# Patient Record
Sex: Male | Born: 1990 | Race: White | Hispanic: No | Marital: Married | Smoking: Never smoker
Health system: Southern US, Community
[De-identification: ages and names within clinical notes are randomized; demographics above are authoritative.]

## PROBLEM LIST (undated history)

## (undated) HISTORY — PX: VASECTOMY: SHX75

---

## 2016-01-10 ENCOUNTER — Encounter: Payer: Self-pay | Admitting: Emergency Medicine

## 2016-01-10 ENCOUNTER — Ambulatory Visit
Admission: EM | Admit: 2016-01-10 | Discharge: 2016-01-10 | Disposition: A | Discharge status: Home / Self Care | Payer: Managed Care, Other (non HMO) | Attending: Family Medicine | Admitting: Family Medicine

## 2016-01-10 DIAGNOSIS — R200 Anesthesia of skin: Secondary | ICD-10-CM | POA: Diagnosis not present

## 2016-01-10 NOTE — ED Notes (Signed)
Patient c/o numbness in his left 2nd finger since last night.  Patient denies pain or injury.

## 2016-01-10 NOTE — ED Provider Notes (Signed)
CSN: [id number]     Arrival date & time 01/10/16  1505 History   First MD Initiated Contact with Patient 01/10/16 1614     Chief Complaint  Patient presents with  . Numbness   (Consider location/radiation/quality/duration/timing/severity/associated sxs/prior Treatment) HPI Comments: 25 yo male with a c/o left index finger numbness since last night. Denies any pain, injury/trauma, fevers, chills, swelling, redness, discoloration, weakness, vision changes, speech or swallowing problems, neck pain, arm pain, arm/facial or lower extremity numbness or weakness. 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   The history is provided by the patient.    History reviewed. No pertinent past medical history. History reviewed. No pertinent past surgical history. History reviewed. No pertinent family history. Social History  Substance Use Topics  . Smoking status: Never Smoker   . Smokeless tobacco: None  . Alcohol Use: Yes    Review of Systems  Allergies  Review of patient's allergies indicates no known allergies.  Home Medications   Prior to Admission medications   Not on File   Meds Ordered and Administered this Visit  Medications - No data to display  BP 135/82 mmHg  Pulse 75  Temp(Src) 97.5 F (36.4 C) (Tympanic)  Resp 16  Ht  (1.854 m)  Wt 240 lb (108.863 kg)  BMI 31.67 kg/m2  SpO2 99% No data found.   Physical Exam  Constitutional: He appears well-developed and well-nourished. No distress.  Cardiovascular: Intact distal pulses.   Musculoskeletal: He exhibits no edema.       Left hand: He exhibits normal range of motion, no tenderness, no bony tenderness, normal two-point discrimination, normal capillary refill, no deformity, no laceration and no swelling. Normal sensation noted. Decreased sensation is not present in the ulnar distribution, is not present in the medial redistribution and is not present in the radial  distribution. Normal strength noted. He exhibits no finger abduction, no thumb/finger opposition and no wrist extension trouble.  Normal pulses and capillary refill of left hand  Neurological: He has normal strength.  Positive phalen's test  Skin: He is not diaphoretic.    ED Course  Procedures (including critical care time)  Labs Review Labs Reviewed - No data to display  Imaging Review No results found.   Visual Acuity Review  Right Eye Distance:   Left Eye Distance:   Bilateral Distance:    Right Eye Near:   Left Eye Near:    Bilateral Near:         MDM   1. Finger numbness   (likely secondary to carpal tunnel syndrome)  There are no discharge medications for this patient.  1. Diagnosis and possible etiologies reviewed with patient 2. Recommend supportive treatment with otc NSAIDS, wrist splint 3. Recommend follow-up with neurologist and/or hand orthopedist for further evaluation and management if no improvement   Payton Mccallum, MD 01/10/16 2138

## 2016-01-10 NOTE — Discharge Instructions (Signed)

## 2017-06-01 ENCOUNTER — Ambulatory Visit: Payer: Managed Care, Other (non HMO) | Admitting: Internal Medicine

## 2017-10-04 ENCOUNTER — Emergency Department
Admission: EM | Admit: 2017-10-04 | Discharge: 2017-10-04 | Disposition: A | Discharge status: Home / Self Care | Payer: PRIVATE HEALTH INSURANCE | Attending: Emergency Medicine | Admitting: Emergency Medicine

## 2017-10-04 ENCOUNTER — Encounter: Payer: Self-pay | Admitting: Emergency Medicine

## 2017-10-04 DIAGNOSIS — S0083XA Contusion of other part of head, initial encounter: Secondary | ICD-10-CM | POA: Insufficient documentation

## 2017-10-04 DIAGNOSIS — Y990 Civilian activity done for income or pay: Secondary | ICD-10-CM | POA: Insufficient documentation

## 2017-10-04 DIAGNOSIS — S0993XA Unspecified injury of face, initial encounter: Secondary | ICD-10-CM | POA: Diagnosis present

## 2017-10-04 DIAGNOSIS — Y9389 Activity, other specified: Secondary | ICD-10-CM | POA: Insufficient documentation

## 2017-10-04 DIAGNOSIS — Y92239 Unspecified place in hospital as the place of occurrence of the external cause: Secondary | ICD-10-CM | POA: Insufficient documentation

## 2017-10-04 MED ORDER — IBUPROFEN 600 MG PO TABS
600.0000 mg | ORAL_TABLET | Freq: Once | ORAL | Status: AC
  Administered 2017-10-04: 600 mg via ORAL
  Filled 2017-10-04: qty 1
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
## 2017-10-04 NOTE — ED Notes (Signed)
Patient does not need UDS per Bynum Bellows, Admin Coordinator.
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
## 2017-10-20 ENCOUNTER — Emergency Department
Admission: EM | Admit: 2017-10-20 | Discharge: 2017-10-20 | Disposition: A | Discharge status: Home / Self Care | Payer: PRIVATE HEALTH INSURANCE | Attending: Emergency Medicine | Admitting: Emergency Medicine

## 2017-10-20 ENCOUNTER — Encounter: Payer: Self-pay | Admitting: Emergency Medicine

## 2017-10-20 DIAGNOSIS — Y92238 Other place in hospital as the place of occurrence of the external cause: Secondary | ICD-10-CM | POA: Insufficient documentation

## 2017-10-20 DIAGNOSIS — S1081XA Abrasion of other specified part of neck, initial encounter: Secondary | ICD-10-CM | POA: Insufficient documentation

## 2017-10-20 DIAGNOSIS — T148XXA Other injury of unspecified body region, initial encounter: Secondary | ICD-10-CM

## 2017-10-20 DIAGNOSIS — Y990 Civilian activity done for income or pay: Secondary | ICD-10-CM | POA: Insufficient documentation

## 2017-10-20 DIAGNOSIS — S199XXA Unspecified injury of neck, initial encounter: Secondary | ICD-10-CM | POA: Diagnosis present

## 2017-10-20 DIAGNOSIS — Y93F9 Activity, other caregiving: Secondary | ICD-10-CM | POA: Insufficient documentation

## 2017-10-20 MED ORDER — BACITRACIN ZINC 500 UNIT/GM EX OINT
TOPICAL_OINTMENT | CUTANEOUS | Status: AC
  Administered 2017-10-20: 22:00:00
  Filled 2017-10-20: qty 2.7

## 2017-10-20 MED ORDER — BACITRACIN ZINC 500 UNIT/GM EX OINT
TOPICAL_OINTMENT | CUTANEOUS | Status: AC
  Administered 2017-10-20: 22:00:00
  Filled 2017-10-20: qty 0.9

## 2017-10-20 NOTE — ED Notes (Signed)
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
## 2017-10-27 ENCOUNTER — Ambulatory Visit
Admission: RE | Admit: 2017-10-27 | Discharge: 2017-10-27 | Disposition: A | Discharge status: Home / Self Care | Payer: PRIVATE HEALTH INSURANCE | Source: Ambulatory Visit | Attending: Family | Admitting: Family

## 2017-10-27 ENCOUNTER — Ambulatory Visit
Admission: RE | Admit: 2017-10-27 | Discharge: 2017-10-27 | Disposition: A | Discharge status: Home / Self Care | Payer: Worker's Compensation | Source: Ambulatory Visit | Attending: Family | Admitting: Family

## 2017-10-27 ENCOUNTER — Other Ambulatory Visit: Payer: Self-pay | Admitting: Family

## 2017-10-27 DIAGNOSIS — M25532 Pain in left wrist: Secondary | ICD-10-CM | POA: Diagnosis present

## 2017-10-27 DIAGNOSIS — R52 Pain, unspecified: Secondary | ICD-10-CM

## 2018-04-15 ENCOUNTER — Ambulatory Visit (INDEPENDENT_AMBULATORY_CARE_PROVIDER_SITE_OTHER): Payer: 59 | Admitting: Family Medicine

## 2018-04-15 ENCOUNTER — Encounter: Payer: Self-pay | Admitting: Family Medicine

## 2018-04-15 VITALS — BP 120/80 | HR 64 | Ht 74.0 in | Wt 250.0 lb

## 2018-04-15 DIAGNOSIS — Z7689 Persons encountering health services in other specified circumstances: Secondary | ICD-10-CM

## 2018-04-15 DIAGNOSIS — Z020 Encounter for examination for admission to educational institution: Secondary | ICD-10-CM

## 2018-04-15 LAB — POCT URINALYSIS DIPSTICK
Bilirubin, UA: NEGATIVE
Blood, UA: NEGATIVE
Glucose, UA: NEGATIVE
KETONES UA: NEGATIVE
Leukocytes, UA: NEGATIVE
Nitrite, UA: NEGATIVE
PH UA: 6 (ref 5.0–8.0)
Protein, UA: NEGATIVE
Spec Grav, UA: 1.01 (ref 1.010–1.025)
UROBILINOGEN UA: 0.2 U/dL

## 2018-04-15 NOTE — Progress Notes (Signed)
Name: Sean Hodge   MRN: [id number]    DOB: 17-Oct-1991   Date:04/15/2018       Progress Note  Subjective  Chief Complaint  Chief Complaint  Patient presents with  . Establish Care    needed pcp  . Annual Exam    school PE    Patients present for establishment of care. 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   No problem-specific Assessment & Plan notes found for this encounter.   History reviewed. No pertinent past medical history.  History reviewed. No pertinent surgical history.  Family History  Problem Relation Age of Onset  . Diabetes Mother   . Hypertension Mother   . Diabetes Father   . Hypertension Father   . Heart disease Father   . Diabetes Maternal Grandmother   . Hypertension Maternal Grandmother   . Diabetes Maternal Grandfather   . Hypertension Maternal Grandfather   . Heart disease Maternal Grandfather   . Diabetes Paternal Grandmother   . Hypertension Paternal Grandmother   . Diabetes Paternal Grandfather   . Hypertension Paternal Grandfather   . Heart disease Paternal Grandfather     Social History   Socioeconomic History  . Marital status: Married    Spouse name: Not on file  . Number of children: Not on file  . Years of education: Not on file  . Highest education level: Not on file  Occupational History  . Not on file  Social Needs  . Financial resource strain: Not on file  . Food insecurity:    Worry: Not on file    Inability: Not on file  . Transportation needs:    Medical: Not on file    Non-medical: Not on file  Tobacco Use  . Smoking status: Never Smoker  . Smokeless tobacco: Never Used  Substance and Sexual Activity  . Alcohol use: No  . Drug use: No  . Sexual activity: Yes  Lifestyle  . Physical activity:    Days per week: Not on file    Minutes per session: Not on file  . Stress: Not on file  Relationships  . Social connections:    Talks on phone: Not on file    Gets together: Not on file   Attends religious service: Not on file    Active member of club or organization: Not on file    Attends meetings of clubs or organizations: Not on file    Relationship status: Not on file  . Intimate partner violence:    Fear of current or ex partner: Not on file    Emotionally abused: Not on file    Physically abused: Not on file    Forced sexual activity: Not on file  Other Topics Concern  . Not on file  Social History Narrative  . Not on file    No Known Allergies  No outpatient medications prior to visit.   No facility-administered medications prior to visit.     Review of Systems  Constitutional: Negative for chills, fever, malaise/fatigue and weight loss.  HENT: Negative for ear discharge, ear pain and sore throat.   Eyes: Negative for blurred vision.  Respiratory: Negative for cough, sputum production, shortness of breath and wheezing.   Cardiovascular: Negative for chest pain, palpitations and leg swelling.  Gastrointestinal: Negative for abdominal pain, blood in stool, constipation, diarrhea, heartburn, melena and nausea.  Genitourinary: Negative for dysuria, frequency, hematuria and urgency.  Musculoskeletal: Negative for back pain, joint pain, myalgias and neck pain.  Skin: Negative for rash.  Neurological: Negative for dizziness, tingling, sensory change, focal weakness and headaches.  Endo/Heme/Allergies: Negative for environmental allergies and polydipsia. Does not bruise/bleed easily.  Psychiatric/Behavioral: Negative for depression and suicidal ideas. The patient is not nervous/anxious and does not have insomnia.      Objective  Vitals:   04/15/18 1445  BP: 120/80  Pulse: 64  Weight: 250 lb (113.4 kg)  Height: 6\' 2"  (1.88 m)    Physical Exam  Constitutional: He is oriented to person, place, and time.  HENT:  Head: Normocephalic.  Right Ear: External ear normal.  Left Ear: External ear normal.  Nose: Nose normal.  Mouth/Throat: Oropharynx is clear  and moist.  Eyes: Pupils are equal, round, and reactive to light. Conjunctivae and EOM are normal. Right eye exhibits no discharge. Left eye exhibits no discharge. No scleral icterus.  Neck: Normal range of motion. Neck supple. No JVD present. No tracheal deviation present. No thyromegaly present.  Cardiovascular: Normal rate, regular rhythm, normal heart sounds and intact distal pulses. Exam reveals no gallop and no friction rub.  No murmur heard. Pulmonary/Chest: Breath sounds normal. No stridor. No respiratory distress. He has no wheezes. He has no rales. He exhibits no tenderness.  Abdominal: Soft. Bowel sounds are normal. He exhibits no mass. There is no hepatosplenomegaly. There is no tenderness. There is no rebound, no guarding and no CVA tenderness.  Musculoskeletal: Normal range of motion. He exhibits no edema or tenderness.  Lymphadenopathy:    He has no cervical adenopathy.  Neurological: He is alert and oriented to person, place, and time. He has normal strength and normal reflexes. He displays normal reflexes. No cranial nerve deficit or sensory deficit. He exhibits normal muscle tone. Coordination normal.  Skin: Skin is warm. No rash noted. No erythema. No pallor.  Nursing note and vitals reviewed.     Assessment & Plan  Problem List Items Addressed This Visit    None    Visit Diagnoses    Establishing care with new doctor, encounter for    -  Primary   Encounter for school history and physical examination       Relevant Orders   Hemoglobin   POCT Urinalysis Dipstick (Completed)      No orders of the defined types were placed in this encounter.     Dr. Hayden Rasmusseneanna Jones [facility]  04/15/18

## 2018-04-16 LAB — HEMOGLOBIN: Hemoglobin: 14.8 g/dL (ref 13.0–17.7)

## 2018-12-03 IMAGING — CR DG WRIST COMPLETE 3+V*L*
1 series · 4 of 4 positions shown · non-contrast
Comparison: None.

CLINICAL DATA: LEFT hand or wrist pain for 1 week

EXAM:
LEFT WRIST - COMPLETE 3+ VIEW

[Series 1: dg wrist complete left · 0.14mm/px · 4 of 4 slices shown]
[im 1/4]
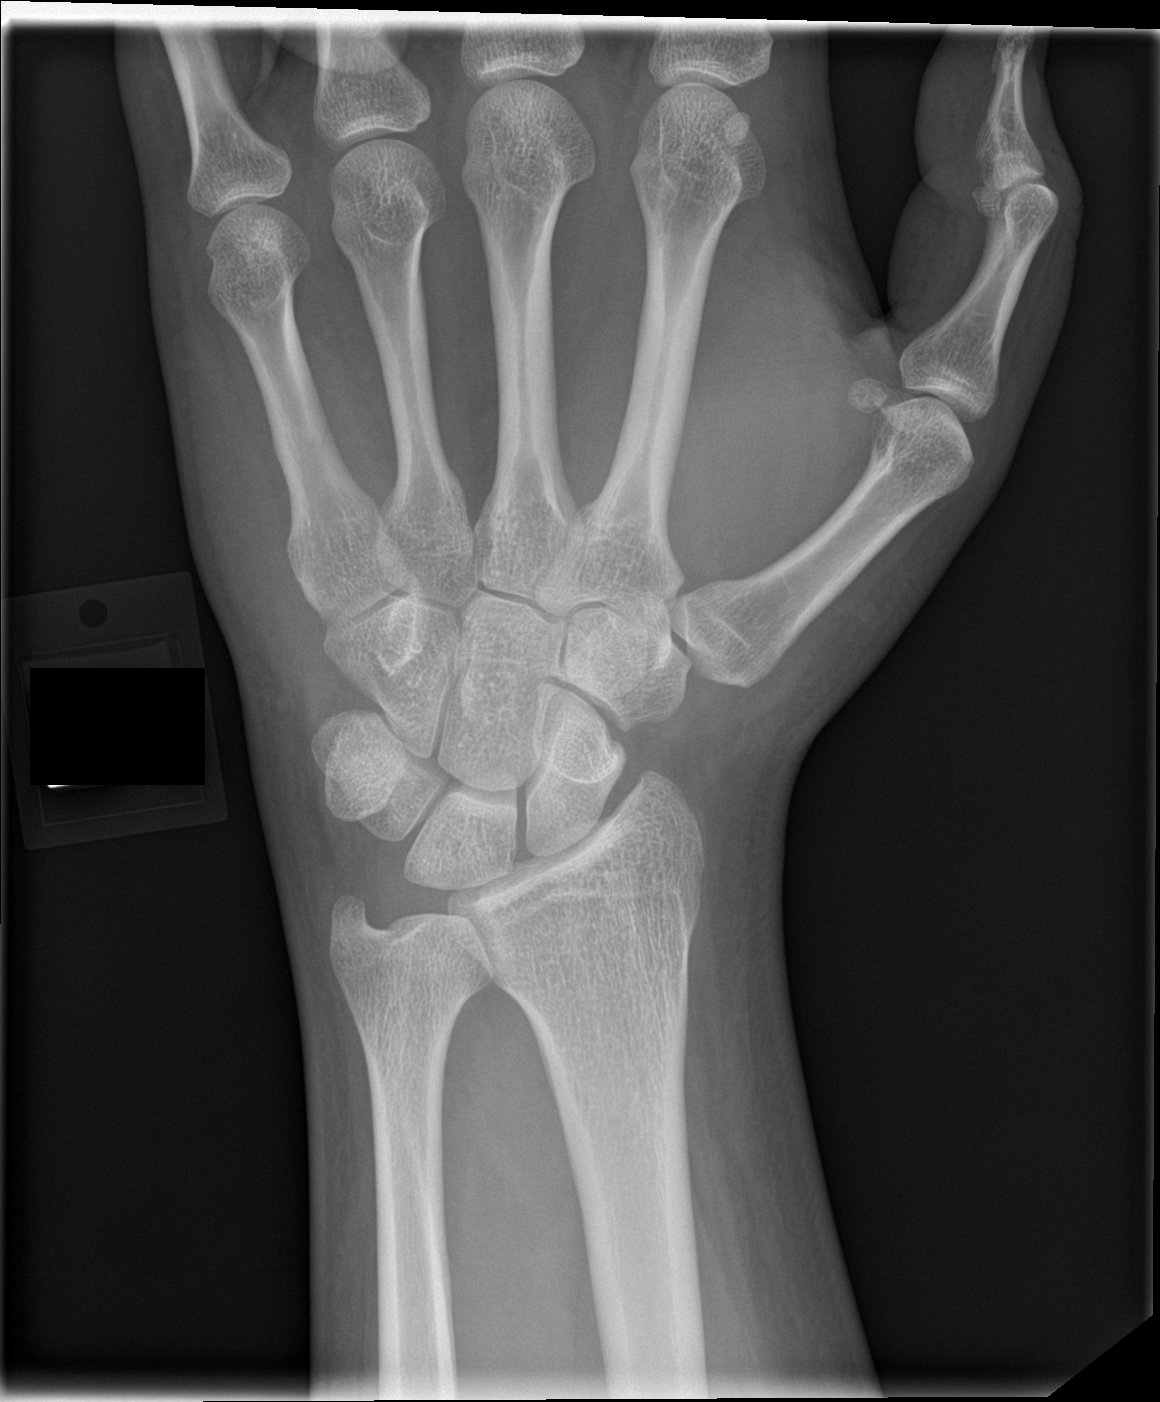
[im 2/4]
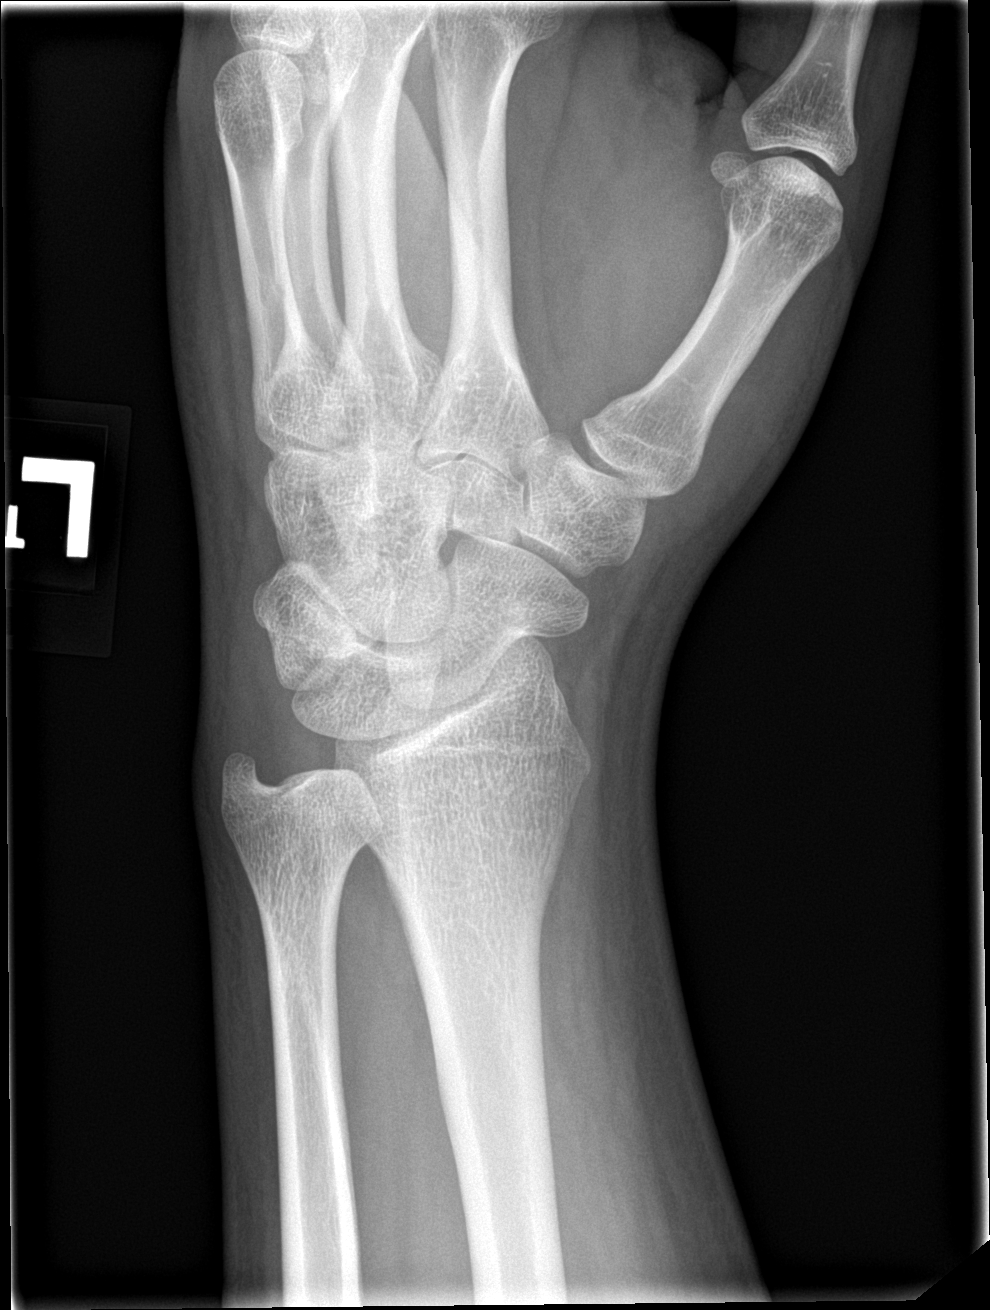
[im 3/4]
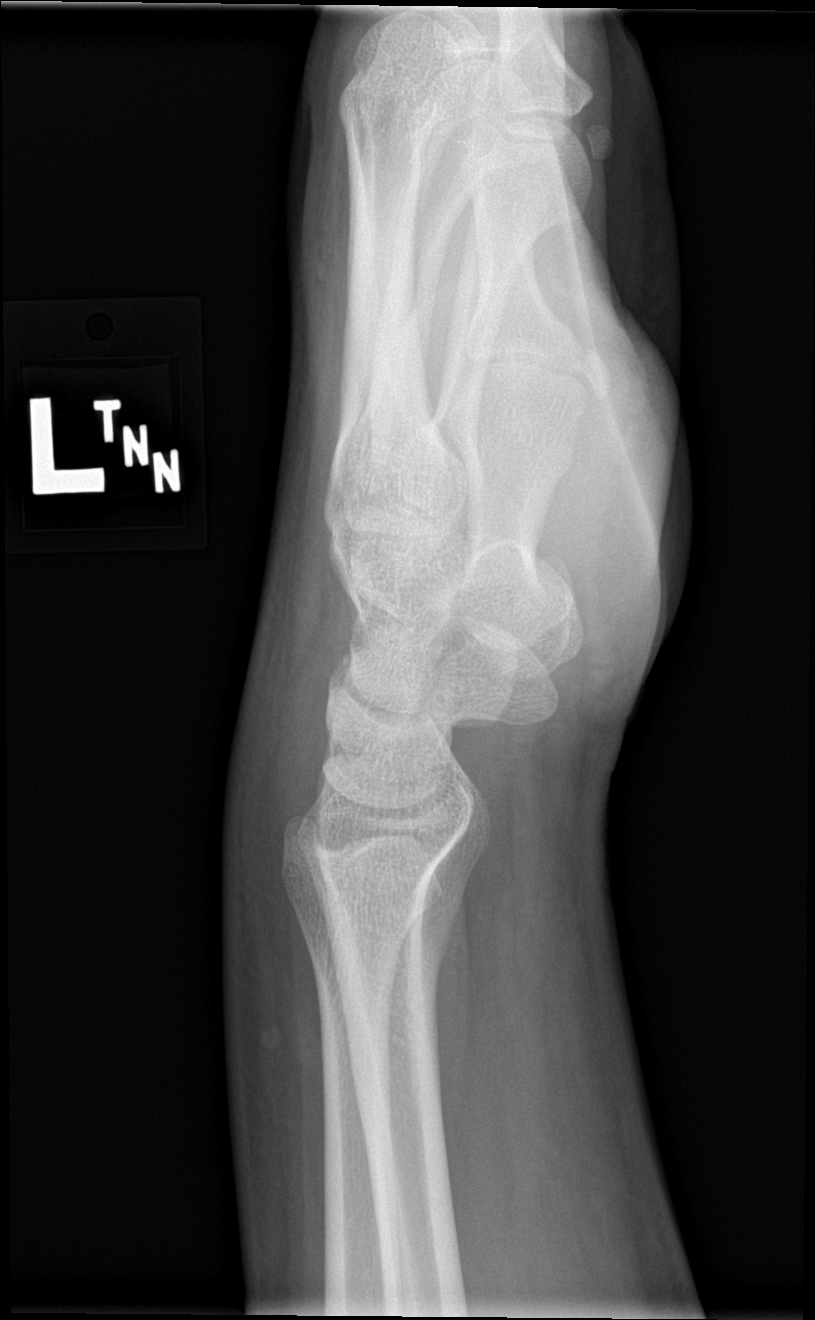
[im 4/4]
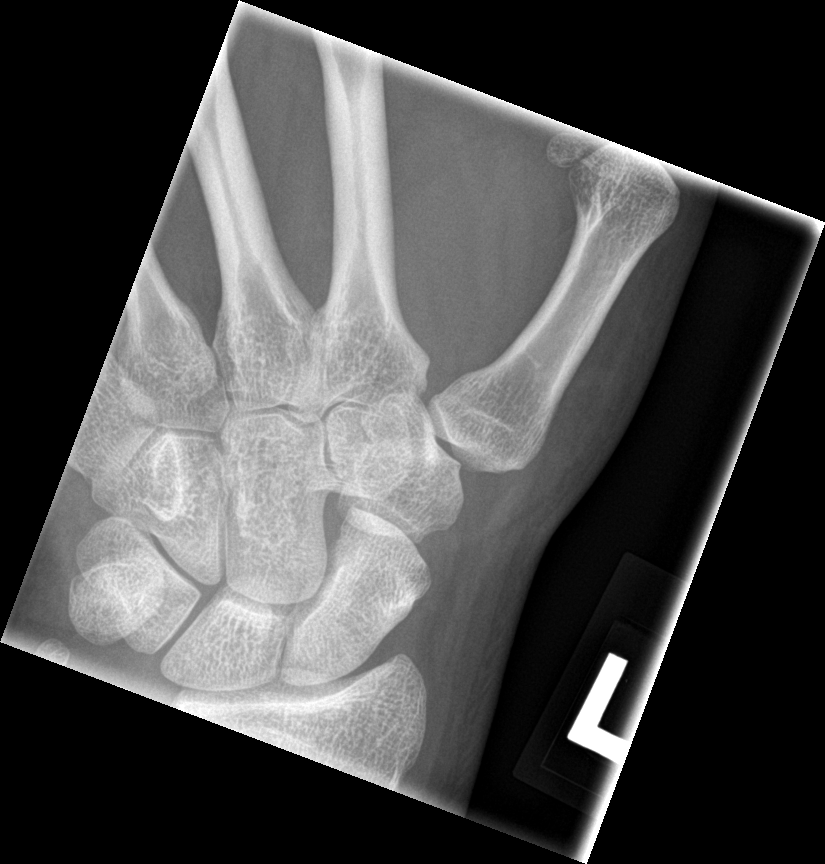

[4 of 4 positions shown; findings below may reference images not displayed]

FINDINGS: No distal radius or ulnar fracture. Radiocarpal joint is intact. No
carpal fracture. No soft tissue abnormality.
IMPRESSION: No fracture or dislocation.
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
## 2021-05-07 ENCOUNTER — Emergency Department
Admission: EM | Admit: 2021-05-07 | Discharge: 2021-05-07 | Disposition: A | Discharge status: Home / Self Care | Payer: No Typology Code available for payment source | Attending: Emergency Medicine | Admitting: Emergency Medicine

## 2021-05-07 ENCOUNTER — Other Ambulatory Visit: Payer: Self-pay

## 2021-05-07 DIAGNOSIS — Y990 Civilian activity done for income or pay: Secondary | ICD-10-CM | POA: Diagnosis not present

## 2021-05-07 DIAGNOSIS — S4991XA Unspecified injury of right shoulder and upper arm, initial encounter: Secondary | ICD-10-CM | POA: Diagnosis not present

## 2021-05-07 DIAGNOSIS — X509XXA Other and unspecified overexertion or strenuous movements or postures, initial encounter: Secondary | ICD-10-CM | POA: Insufficient documentation

## 2021-05-07 MED ORDER — KETOROLAC TROMETHAMINE 30 MG/ML IJ SOLN
30.0000 mg | Freq: Once | INTRAMUSCULAR | Status: AC
  Administered 2021-05-07: 30 mg via INTRAMUSCULAR
  Filled 2021-05-07: qty 1

## 2021-05-07 MED ORDER — MELOXICAM 15 MG PO TABS: 15.0000 mg | ORAL_TABLET | Freq: Every day | ORAL | 0 refills | Status: DC
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
## 2021-05-07 NOTE — ED Provider Notes (Signed)
[facility] Emergency Department Provider Note  ____________________________________________  Time seen: Approximately 7:43 PM  I have reviewed the triage vital signs and the nursing notes.   HISTORY  Chief Complaint Shoulder Pain    HPI Sean Hodge is a 30 y.o. male who presents the emergency department complaining of an injury to the right shoulder.  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  Patient states that this individual was not extremely obese/heavy, however it was "dead weight" and he felt a slight twinge in her shoulder.  There was no popping sensation, no immediate surgery pain.  Patient has had progressive right shoulder pain running along the anterior and superior aspect of the shoulder joint itself.  Patient now has limited range of motion due to pain.  No numbness and tingling in the hand.  No pain in the neck.  Patient states that the pain has been progressively worsening.  He denies any areas of ecchymosis or gross edema at this time.         History reviewed. No pertinent past medical history.  There are no problems to display for this patient.   Past Surgical History:  Procedure Laterality Date  . VASECTOMY      Prior to Admission medications   Medication Sig Start Date End Date Taking? Authorizing Provider  meloxicam (MOBIC) 15 MG tablet Take 1 tablet (15 mg total) by mouth daily. 05/07/21  Yes Shuayb Schepers, Delorise Royals, PA-C    Allergies Patient has no known allergies.  Family History  Problem Relation Age of Onset  . Diabetes Mother   . Hypertension Mother   . Diabetes Father   . Hypertension Father   . Heart disease Father   . Diabetes Maternal Grandmother   . Hypertension Maternal Grandmother   . Diabetes Maternal Grandfather   . Hypertension Maternal Grandfather   . Heart disease Maternal Grandfather   . Diabetes Paternal Grandmother   . Hypertension Paternal Grandmother   .  Diabetes Paternal Grandfather   . Hypertension Paternal Grandfather   . Heart disease Paternal Grandfather     Social History Social History   Tobacco Use  . Smoking status: Never Smoker  . Smokeless tobacco: Never Used  Vaping Use  . Vaping Use: Never used  Substance Use Topics  . Alcohol use: Yes    Comment: occ  . Drug use: No     Review of Systems  Constitutional: No fever/chills Eyes: No visual changes. No discharge ENT: No upper respiratory complaints. Cardiovascular: no chest pain. Respiratory: no cough. No SOB. Gastrointestinal: No abdominal pain.  No nausea, no vomiting.  No diarrhea.  No constipation. Musculoskeletal: Positive for right shoulder pain/injury Skin: Negative for rash, abrasions, lacerations, ecchymosis. Neurological: Negative for headaches, focal weakness or numbness.  10 System ROS otherwise negative.  ____________________________________________   PHYSICAL EXAM:  VITAL SIGNS: ED Triage Vitals  Enc Vitals Group     BP 05/07/21 1918 (!) 142/96     Pulse Rate 05/07/21 1918 77     Resp 05/07/21 1918 18     Temp 05/07/21 1918 98.5 F (36.9 C)     Temp Source 05/07/21 1918 Oral     SpO2 05/07/21 1918 99 %     Weight 05/07/21 1919 260 lb (117.9 kg)     Height 05/07/21 1919 6\' 2"  (1.88 m)     Head Circumference --      Peak Flow --  Pain Score 05/07/21 1919 8     Pain Loc --      Pain Edu? --      Excl. in GC? --      Constitutional: Alert and oriented. Well appearing and in no acute distress. Eyes: Conjunctivae are normal. PERRL. EOMI. Head: Atraumatic. ENT:      Ears:       Nose: No congestion/rhinnorhea.      Mouth/Throat: Mucous membranes are moist.  Neck: No stridor.  No cervical spine tenderness to palpation.  Cardiovascular: Normal rate, regular rhythm. Normal S1 and S2.  Good peripheral circulation. Respiratory: Normal respiratory effort without tachypnea or retractions. Lungs CTAB. Good air entry to the bases with no  decreased or absent breath sounds. Musculoskeletal: Decreased range of motion to the right upper extremity/shoulder otherwise full range of motion to all extremities. No gross deformities appreciated.  Patient is grossly tender to palpation along the [facility] joint.  There is no palpable abnormality or deficit.  Mild tenderness extends along the lateral joint and slightly into the superior aspect of the shoulder.  However majority of tenderness again was over the [facility] joint itself.  No tenderness over the clavicle or scapula.  Examination of the humerus, elbow and forearm is unremarkable.  Radial pulses sensation intact distally.  Patient has positive liftoff, Neer's and empty can test. Neurologic:  Normal speech and language. No gross focal neurologic deficits are appreciated.  Skin:  Skin is warm, dry and intact. No rash noted. Psychiatric: Mood and affect are normal. Speech and behavior are normal. Patient exhibits appropriate insight and judgement.   ____________________________________________   LABS (all labs ordered are listed, but only abnormal results are displayed)  Labs Reviewed - No data to display ____________________________________________  EKG   ____________________________________________  RADIOLOGY   No results found.  ____________________________________________    PROCEDURES  Procedure(s) performed:    Procedures    Medications  ketorolac (TORADOL) 30 MG/ML injection 30 mg (has no administration in time range)     ____________________________________________   INITIAL IMPRESSION / ASSESSMENT AND PLAN / ED COURSE  Pertinent labs & imaging results that were available during my care of the patient were reviewed by me and considered in my medical decision making (see chart for details).  Review of the [place] CSRS was performed in accordance of the [facility] prior to dispensing any controlled drugs.           Patient's diagnosis is consistent with shoulder  injury.  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  Patient has had increasing pain to the shoulder and decreasing range of motion due to the pain.  Patient is extremely tender over the [facility] joint.  At this time with mechanism of injury differential included AC separation, biceps injury, rotator cuff tear, impingement syndrome.  I feel that it is less likely based off of exam that the patient has fully torn any of the shoulder muscle/ligaments however this still remains on the differential given the positive nature of multiple special tests of the shoulder.  Patient is extremely tender over the [facility] joint concerning for impingement syndrome which would also match the patient's symptoms.  At this time I will give the patient a sling for some symptom control but I encouraged the patient to take sling out and use pendulum motions to prevent frozen shoulder syndrome.  Patient will have shot of Toradol plus anti-inflammatory at  home.  Follow-up with orthopedics for further management of shoulder injury/pain. Patient is given ED precautions to return to the ED for any worsening or new symptoms.     ____________________________________________  FINAL CLINICAL IMPRESSION(S) / ED DIAGNOSES  Final diagnoses:  Injury of right shoulder, initial encounter      NEW MEDICATIONS STARTED DURING THIS VISIT:  ED Discharge Orders         Ordered    meloxicam (MOBIC) 15 MG tablet  Daily        05/07/21 2013              This chart was dictated using voice recognition software/Dragon. Despite best efforts to proofread, errors can occur which can change the meaning. Any change was purely unintentional.    Racheal Patches, PA-C 05/07/21 2019    Shaune Pollack, MD 05/13/21 2052

## 2022-04-27 ENCOUNTER — Other Ambulatory Visit (LOCAL_COMMUNITY_HEALTH_CENTER): Payer: Self-pay

## 2022-04-27 DIAGNOSIS — Z111 Encounter for screening for respiratory tuberculosis: Secondary | ICD-10-CM

## 2022-04-27 DIAGNOSIS — Z0184 Encounter for antibody response examination: Secondary | ICD-10-CM

## 2022-04-27 NOTE — Progress Notes (Signed)
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 Pt states he has had hx chicken pox and one varicella vaccine. No [facility] on file and no vaccine record with him today. ROI signed. Pt unsure whether he would be able to view results on MyChart . Per Epic, MyChart is active and this was explained to pt. RN also explained a paper copy of results can be made available to him if desires. RN will plan to call him with results [phone]). RN walked pt to lab. Jerel Shepherd, RN ?  ?

## 2022-05-01 ENCOUNTER — Telehealth: Payer: Self-pay | Admitting: Family Medicine

## 2022-05-01 NOTE — Telephone Encounter (Signed)
Pt came in to get his titer results, but they were not in yet. Please call him as soon as they are ready, so that he can come pick them up. Thanks ?

## 2022-05-02 LAB — QUANTIFERON-TB GOLD PLUS
QuantiFERON Mitogen Value: >10 IU/mL
QuantiFERON Nil Value: 0.15 IU/mL
QuantiFERON TB1 Ag Value: 0.52 IU/mL
QuantiFERON TB2 Ag Value: 0.38 IU/mL
QuantiFERON-TB Gold Plus: POSITIVE — AB

## 2022-05-02 LAB — VARICELLA ZOSTER ANTIBODY, IGG: Varicella zoster IgG: 513 index (ref >165)

## 2022-05-04 NOTE — Telephone Encounter (Signed)
Incoming call from pt. RN spoke to pt and discussed  Positive QFT gold results and Varicella titer results which show immunity. M Dorminy, RN to contact pt tomorrow for chest xray and epi. See lab note for details. Jerel Shepherd, RN ? ?

## 2022-05-04 NOTE — Progress Notes (Signed)
Reviewed. Phone call from pt and RN discussed lab results : +QFT and Varicella titer with pt. Pt denies any symptoms of fever, cough, breathing difficulties, wt loss, night sweats, chest pain. RN explained that M Dorminy,RN (TB coord) will contact pt [phone]) tomorrow to further discuss +QFT and next steps regarding chest xray and epi. Pt plans to work tomorrow. Questions answered and reports understanding.  ? ?Varicella titer results showing immunity. No vaccine or treatment indicated. [facility]IR updated.  Jerel Shepherd, RN ?

## 2022-05-05 ENCOUNTER — Other Ambulatory Visit: Payer: 59

## 2022-05-05 ENCOUNTER — Telehealth: Payer: Self-pay

## 2022-05-05 DIAGNOSIS — Z111 Encounter for screening for respiratory tuberculosis: Secondary | ICD-10-CM

## 2022-05-05 NOTE — Progress Notes (Signed)
Patient in clinic today for repeat (no charge) QFT.  Patient had a recent positive QFT but in the past has had negative PPDs (2019, 2021 and 2023).  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 TB RN contact info given to patient.  Patient needs results by the weekend.Richmond Campbell, RN  ?

## 2022-05-05 NOTE — Telephone Encounter (Signed)
TC with patient.  Discussed +QFT. Explained we feel  that this is a false positive given that he recently had a neg PPD this month and neg PPDs in 2019 and 2021.  Denies TB sx's.  Patient will return to clinic this afternoon for a repeat QFT.  Patient states he needs a negative QFT by Saturday for school. TB RN explained that once we draw the sample it goes to [facility]. We will hopefully have results by Friday but in some cases it can take up to a week. TB RN can provide a letter for school if results are delayed. Patient will arrive at [facility] at 1:30 for QFT Sean Campbell, RN  ?

## 2022-05-09 LAB — QUANTIFERON-TB GOLD PLUS
QuantiFERON Mitogen Value: >10 IU/mL
QuantiFERON Nil Value: 0.16 IU/mL
QuantiFERON TB1 Ag Value: 0.36 IU/mL
QuantiFERON TB2 Ag Value: 0.48 IU/mL
QuantiFERON-TB Gold Plus: NEGATIVE

## 2022-07-24 ENCOUNTER — Other Ambulatory Visit: Payer: Self-pay | Admitting: Student

## 2022-07-24 ENCOUNTER — Ambulatory Visit
Admission: RE | Admit: 2022-07-24 | Discharge: 2022-07-24 | Disposition: A | Discharge status: Home / Self Care | Payer: No Typology Code available for payment source | Source: Ambulatory Visit | Attending: Student | Admitting: Student

## 2022-07-24 DIAGNOSIS — M79621 Pain in right upper arm: Secondary | ICD-10-CM

## 2024-01-26 DIAGNOSIS — M19111 Post-traumatic osteoarthritis, right shoulder: Secondary | ICD-10-CM | POA: Insufficient documentation

## 2025-01-01 ENCOUNTER — Ambulatory Visit
Admission: EM | Admit: 2025-01-01 | Discharge: 2025-01-01 | Disposition: A | Discharge status: Home / Self Care | Attending: Emergency Medicine | Admitting: Emergency Medicine

## 2025-01-01 DIAGNOSIS — Z20828 Contact with and (suspected) exposure to other viral communicable diseases: Secondary | ICD-10-CM

## 2025-01-01 DIAGNOSIS — U071 COVID-19: Secondary | ICD-10-CM

## 2025-01-01 MED ORDER — IPRATROPIUM BROMIDE 0.06 % NA SOLN: 2.0000 | Freq: Four times a day (QID) | NASAL | 0 refills | Status: AC

## 2025-01-01 MED ORDER — OSELTAMIVIR PHOSPHATE 75 MG PO CAPS: 75.0000 mg | ORAL_CAPSULE | Freq: Two times a day (BID) | ORAL | 0 refills | Status: AC

## 2025-01-01 MED ORDER — IBUPROFEN 600 MG PO TABS: 600.0000 mg | ORAL_TABLET | Freq: Four times a day (QID) | ORAL | 0 refills | Status: AC | PRN

## 2025-01-01 NOTE — ED Provider Notes (Signed)
 " HPI  SUBJECTIVE:  Sean Hodge is a 34 y.o. male who presents with headache, fatigue, shortness of breath, body aches starting yesterday.  His wife had COVID and flu last week.  No fevers, nasal congestion, rhinorrhea, postnasal drip, sore throat, coughing, wheezing, nausea, vomiting, diarrhea, abdominal pain.  He did not get the COVID or flu vaccines.  No antipyretic in the past 6 hours.  He tried rest and fluids without improvement in his symptoms.  Symptoms are worse with exertion.  States he had a positive home COVID test 3 hours ago.  He did not take a flu test.  He has no past medical history.  PCP: None.  History reviewed. No pertinent past medical history.  Past Surgical History:  Procedure Laterality Date   VASECTOMY      Family History  Problem Relation Age of Onset   Diabetes Mother    Hypertension Mother    Diabetes Father    Hypertension Father    Heart disease Father    Diabetes Maternal Grandmother    Hypertension Maternal Grandmother    Diabetes Maternal Grandfather    Hypertension Maternal Grandfather    Heart disease Maternal Grandfather    Diabetes Paternal Grandmother    Hypertension Paternal Grandmother    Diabetes Paternal Grandfather    Hypertension Paternal Grandfather    Heart disease Paternal Grandfather     Social History[1]  Current Medications[2]  Allergies[3]   ROS  As noted in HPI.   Physical Exam  BP (!) 137/91 (BP Location: Left Arm)   Pulse 100   Temp 98.7 F (37.1 C) (Oral)   Resp 18   Wt 120.2 kg   SpO2 95%   BMI 34.02 kg/m   Constitutional: Well developed, well nourished, no acute distress Eyes: PERRL, EOMI, conjunctiva normal bilaterally HENT: Normocephalic, atraumatic,mucus membranes moist.  Positive nasal congestion.  No maxillary, frontal sinus tenderness. Respiratory: Clear to auscultation bilaterally, no rales, no wheezing, no rhonchi Cardiovascular: Borderline regular tachycardia, no murmurs, no gallops, no  rubs GI: nondistended skin: No rash, skin intact Musculoskeletal: no deformities Neurologic: Alert & oriented x 3, CN III-XII grossly intact, no motor deficits, sensation grossly intact Psychiatric: Speech and behavior appropriate   ED Course   Medications - No data to display  No orders of the defined types were placed in this encounter.  No results found for this or any previous visit (from the past 24 hours). No results found.  ED Clinical Impression  1. COVID-19 virus infection   2. Exposure to influenza      ED Assessment/Plan     Patient presents with an acute illness with systemic symptoms of tachycardia.  Patient presents with a positive COVID test and significant exposure to influenza.  We discussed antivirals due to lack of vaccination for COVID, but I think he will do well without them and he declined them.  Will send home with a prescription for Tamiflu .  He is to do a flu test and let us  know if it is positive so that we have documentation of that.  Home with Tylenol/ibuprofen , Atrovent  nasal spray, Mucinex D.  Advised that he must mask for the next 10 days around others at all times.  Work note.  He declined a primary care list, assistance in finding a PCP, or other medications.  Discussed MDM, treatment plan, and plan for follow-up with patient  patient agrees with plan.   Meds ordered this encounter  Medications   ipratropium (ATROVENT ) 0.06 %  nasal spray    Sig: Place 2 sprays into both nostrils 4 (four) times daily.    Dispense:  15 mL    Refill:  0   ibuprofen  (ADVIL ) 600 MG tablet    Sig: Take 1 tablet (600 mg total) by mouth every 6 (six) hours as needed.    Dispense:  30 tablet    Refill:  0   oseltamivir  (TAMIFLU ) 75 MG capsule    Sig: Take 1 capsule (75 mg total) by mouth 2 (two) times daily. X 5 days    Dispense:  10 capsule    Refill:  0      *This clinic note was created using Scientist, clinical (histocompatibility and immunogenetics). Therefore, there may be occasional  mistakes despite careful proofreading. ?      [1]  Social History Tobacco Use   Smoking status: Never   Smokeless tobacco: Never  Vaping Use   Vaping status: Never Used  Substance Use Topics   Alcohol use: Yes    Comment: occ   Drug use: No  [2] No current facility-administered medications for this encounter.  Current Outpatient Medications:    ibuprofen  (ADVIL ) 600 MG tablet, Take 1 tablet (600 mg total) by mouth every 6 (six) hours as needed., Disp: 30 tablet, Rfl: 0   ipratropium (ATROVENT ) 0.06 % nasal spray, Place 2 sprays into both nostrils 4 (four) times daily., Disp: 15 mL, Rfl: 0   oseltamivir  (TAMIFLU ) 75 MG capsule, Take 1 capsule (75 mg total) by mouth 2 (two) times daily. X 5 days, Disp: 10 capsule, Rfl: 0 [3] No Known Allergies    Van Knee, MD 01/05/25 1558  "

## 2025-01-01 NOTE — ED Notes (Signed)
 Reviewed work note

## 2025-01-01 NOTE — Discharge Instructions (Signed)
 Take a home flu test and let us  know if it is positive.  I have sent you home with a prescription of Tamiflu  in case it is positive.  Supportive treatment for COVID.  600 mg of ibuprofen , 1000 mg of Tylenol together 3-4 times a day as needed for body aches, headaches, push electrolyte containing fluids such as Pedialyte, liquid IV and Gatorade until your urine is clear, Atrovent  nasal spray, Mucinex D for nasal congestion.  Saline nasal irrigation with a NeilMed sinus rinse and distilled water can also be very helpful.  You must mask for the next 10 days.  Yesterday was day 0.  You can return to work when afebrile without the use of Tylenol or ibuprofen  for 24 hours and you are feeling better.

## 2025-01-01 NOTE — ED Triage Notes (Signed)
 Pt c/o HA,chills,fatigue & bodyaches x1 day. Denies fevers. No OTC meds.
